# Patient Record
Sex: Female | Born: 1980 | Race: Black or African American | Hispanic: No | Marital: Single | Smoking: Never smoker
Health system: Southern US, Community
[De-identification: ages and names within clinical notes are randomized; demographics above are authoritative.]

## PROBLEM LIST (undated history)

## (undated) DIAGNOSIS — I10 Essential (primary) hypertension: Secondary | ICD-10-CM

## (undated) HISTORY — PX: FOOT SURGERY: SHX648

---

## 2006-06-28 ENCOUNTER — Ambulatory Visit: Payer: Self-pay | Admitting: Podiatry

## 2006-07-20 ENCOUNTER — Ambulatory Visit: Payer: Self-pay | Admitting: Podiatry

## 2006-08-04 ENCOUNTER — Ambulatory Visit: Payer: Self-pay | Admitting: Podiatry

## 2006-11-13 ENCOUNTER — Emergency Department: Payer: Self-pay | Admitting: Emergency Medicine

## 2008-01-25 ENCOUNTER — Ambulatory Visit: Payer: Self-pay | Admitting: Obstetrics and Gynecology

## 2008-03-23 ENCOUNTER — Observation Stay: Payer: Self-pay | Admitting: Obstetrics and Gynecology

## 2008-06-22 ENCOUNTER — Emergency Department: Payer: Self-pay

## 2010-08-14 ENCOUNTER — Ambulatory Visit: Payer: Self-pay | Admitting: Podiatry

## 2011-04-03 ENCOUNTER — Emergency Department: Payer: Self-pay | Admitting: Internal Medicine

## 2012-04-02 ENCOUNTER — Emergency Department: Payer: Self-pay | Admitting: Emergency Medicine

## 2012-10-02 ENCOUNTER — Emergency Department: Payer: Self-pay | Admitting: Emergency Medicine

## 2016-02-17 ENCOUNTER — Emergency Department
Admission: EM | Admit: 2016-02-17 | Discharge: 2016-02-17 | Disposition: A | Discharge status: Home / Self Care | Payer: BLUE CROSS/BLUE SHIELD | Attending: Emergency Medicine | Admitting: Emergency Medicine

## 2016-02-17 ENCOUNTER — Emergency Department: Payer: BLUE CROSS/BLUE SHIELD

## 2016-02-17 ENCOUNTER — Encounter: Payer: Self-pay | Admitting: Emergency Medicine

## 2016-02-17 DIAGNOSIS — I10 Essential (primary) hypertension: Secondary | ICD-10-CM | POA: Diagnosis not present

## 2016-02-17 DIAGNOSIS — R42 Dizziness and giddiness: Secondary | ICD-10-CM | POA: Diagnosis not present

## 2016-02-17 HISTORY — DX: Essential (primary) hypertension: I10

## 2016-02-17 LAB — URINALYSIS COMPLETE WITH MICROSCOPIC (ARMC ONLY)
BILIRUBIN URINE: NEGATIVE
Bacteria, UA: NONE SEEN
GLUCOSE, UA: NEGATIVE mg/dL
HGB URINE DIPSTICK: NEGATIVE
KETONES UR: NEGATIVE mg/dL
LEUKOCYTES UA: NEGATIVE
NITRITE: NEGATIVE
Protein, ur: NEGATIVE mg/dL
RBC / HPF: NONE SEEN RBC/hpf (ref 0–5)
SPECIFIC GRAVITY, URINE: 1.01 (ref 1.005–1.030)
pH: 6 (ref 5.0–8.0)

## 2016-02-17 LAB — BASIC METABOLIC PANEL
ANION GAP: 7 (ref 5–15)
BUN: 10 mg/dL (ref 6–20)
CALCIUM: 9 mg/dL (ref 8.9–10.3)
CHLORIDE: 105 mmol/L (ref 101–111)
CO2: 25 mmol/L (ref 22–32)
Creatinine, Ser: 0.93 mg/dL (ref 0.44–1.00)
GFR calc Af Amer: >60 mL/min (ref >60)
GFR calc non Af Amer: >60 mL/min (ref >60)
GLUCOSE: 100 mg/dL — AB (ref 65–99)
Potassium: 4 mmol/L (ref 3.5–5.1)
Sodium: 137 mmol/L (ref 135–145)

## 2016-02-17 LAB — TROPONIN I: Troponin I: <0.03 ng/mL (ref <0.03)

## 2016-02-17 LAB — CBC
HCT: 36.3 % (ref 35.0–47.0)
HEMOGLOBIN: 12.7 g/dL (ref 12.0–16.0)
MCH: 28.8 pg (ref 26.0–34.0)
MCHC: 34.9 g/dL (ref 32.0–36.0)
MCV: 82.4 fL (ref 80.0–100.0)
PLATELETS: 220 10*3/uL (ref 150–440)
RBC: 4.4 MIL/uL (ref 3.80–5.20)
RDW: 14.4 % (ref 11.5–14.5)
WBC: 10.3 10*3/uL (ref 3.6–11.0)

## 2016-02-17 MED ORDER — MECLIZINE HCL 25 MG PO TABS: 25.0000 mg | ORAL_TABLET | Freq: Three times a day (TID) | ORAL | 0 refills | Status: AC | PRN

## 2016-02-17 MED ORDER — CLONIDINE HCL 0.1 MG PO TABS: 0.1000 mg | ORAL_TABLET | Freq: Two times a day (BID) | ORAL | 0 refills | Status: AC

## 2016-02-17 MED ORDER — MECLIZINE HCL 25 MG PO TABS
25.0000 mg | ORAL_TABLET | Freq: Once | ORAL | Status: AC
  Administered 2016-02-17: 25 mg via ORAL
  Filled 2016-02-17: qty 1

## 2016-02-17 NOTE — Discharge Instructions (Signed)
1. Keep a record of your blood pressures morning, noon and evening. 2. You may take clonidine 0.1 mg twice daily as needed for elevated blood pressure as follows: If the top number is > 180 and/or The bottom number is > 100 3. You may take meclizine as needed for dizziness. 4. Return to the ER for worsening symptoms, persistent vomiting, difficulty breathing or other concerns.

## 2016-02-17 NOTE — ED Triage Notes (Signed)
Patient states that she started feeling dizzy last night and she checked her blood pressure and it was 150/102. Patient states that she does have a history of htn but is normally well controlled with medications.

## 2016-02-17 NOTE — ED Provider Notes (Signed)
[facility] Emergency Department Provider Note   ____________________________________________   First MD Initiated Contact with Patient 02/17/16 0402     (approximate)  I have reviewed the triage vital signs and the nursing notes.   HISTORY  Chief Complaint Dizziness and Hypertension    HPI Melanie Anthony is a 35 y.o. female who presents to the ED from home with a chief complaint of elevated blood pressure and dizziness. Reports she was feeling dizzy last evening so she checked her blood pressure which was 150/102. Baseline BP 140s/80s. Patient does have a history of hypertension on Bystolic taken daily every morning. Denies recent fever, chills, chest pain, shortness of breath, abdominal pain, nausea, vomiting, diarrhea. Denies recent travel or trauma. Nothing makes her symptoms better or worse.   Past Medical History:  Diagnosis Date  . Hypertension     There are no active problems to display for this patient.   Past Surgical History:  Procedure Laterality Date  . FOOT SURGERY      Prior to Admission medications   Medication Sig Start Date End Date Taking? Authorizing Provider  cloNIDine (CATAPRES) 0.1 MG tablet Take 1 tablet (0.1 mg total) by mouth 2 (two) times daily. 02/17/16   Irean HongJade J Zikeria Keough, MD  meclizine (ANTIVERT) 25 MG tablet Take 1 tablet (25 mg total) by mouth 3 (three) times daily as needed for dizziness. 02/17/16   Irean HongJade J Nassim Cosma, MD    Allergies Review of patient's allergies indicates no known allergies.  No family history on file.  Social History Social History  Substance Use Topics  . Smoking status: Never Smoker  . Smokeless tobacco: Never Used  . Alcohol use Not on file     Comment: occ    Review of Systems  Constitutional: No fever/chills. Eyes: No visual changes. ENT: No sore throat. Cardiovascular: Denies chest pain. Respiratory: Denies shortness of breath. Gastrointestinal: No abdominal pain.  No nausea, no  vomiting.  No diarrhea.  No constipation. Genitourinary: Negative for dysuria. Musculoskeletal: Negative for back pain. Skin: Negative for rash. Neurological: Positive for dizziness. Negative for headaches, focal weakness or numbness.  10-point ROS otherwise negative.  ____________________________________________   PHYSICAL EXAM:  VITAL SIGNS: ED Triage Vitals [02/17/16 0105]  Enc Vitals Group     BP (!) 160/94     Pulse Rate 68     Resp 18     Temp 97.7 F (36.5 C)     Temp Source Oral     SpO2 100 %     Weight 170 lb (77.1 kg)     Height 5\' 2"  (1.575 m)     Head Circumference      Peak Flow      Pain Score      Pain Loc      Pain Edu?      Excl. in GC?     Constitutional: Alert and oriented. Well appearing and in no acute distress. Eyes: Conjunctivae are normal. PERRL. EOMI. Head: Atraumatic. Nose: No congestion/rhinnorhea. Mouth/Throat: Mucous membranes are moist.  Oropharynx non-erythematous. Neck: No stridor.  No carotid bruits. Cardiovascular: Normal rate, regular rhythm. Grossly normal heart sounds.  Good peripheral circulation. Respiratory: Normal respiratory effort.  No retractions. Lungs CTAB. Gastrointestinal: Soft and nontender. No distention. No abdominal bruits. No CVA tenderness. Musculoskeletal: No lower extremity tenderness nor edema.  No joint effusions. Neurologic:  Alert and oriented 4. CN II-XII grossly intact. Normal speech and language. No gross focal neurologic deficits are appreciated. No  gait instability. Skin:  Skin is warm, dry and intact. No rash noted. Psychiatric: Mood and affect are normal. Speech and behavior are normal.  ____________________________________________   LABS (all labs ordered are listed, but only abnormal results are displayed)  Labs Reviewed  BASIC METABOLIC PANEL - Abnormal; Notable for the following:       Result Value   Glucose, Bld 100 (*)    All other components within normal limits  URINALYSIS  COMPLETEWITH MICROSCOPIC ([facility] ONLY) - Abnormal; Notable for the following:    Color, Urine STRAW (*)    APPearance CLEAR (*)    Squamous Epithelial / LPF 0-5 (*)    All other components within normal limits  CBC  TROPONIN I  CBG MONITORING, ED   ____________________________________________  EKG  ED ECG REPORT I, Benicio Manna J, the attending physician, personally viewed and interpreted this ECG.   Date: 02/17/2016  EKG Time: 0112  Rate: 61  Rhythm: normal EKG, normal sinus rhythm  Axis: Normal  Intervals:none  ST&T Change: Nonspecific  ____________________________________________  RADIOLOGY  CT head interpreted per Dr. Gwenyth Bender: No acute intracranial pathology. ____________________________________________   PROCEDURES  Procedure(s) performed: None  Procedures  Critical Care performed: No  ____________________________________________   INITIAL IMPRESSION / ASSESSMENT AND PLAN / ED COURSE  Pertinent labs & imaging results that were available during my care of the patient were reviewed by me and considered in my medical decision making (see chart for details).  35 year old female presenting with elevated blood pressure with associated dizziness. Initial laboratory urinalysis results are unremarkable. Will add troponin, head CT, check orthostatics and administer meclizine for dizziness.  Clinical Course  Comment By Time  Dizziness improved. Patient usually takes her blood pressure medicine Bystolic at approximately 9 AM. I have asked her to take it now for elevated blood pressure. Will prescribe clonidine to use as needed for elevated blood pressure as well as meclizine as needed for dizziness. She will follow-up with her PCP closely this week. Strict return precautions given. Patient and spouse verbalize understanding and agree with plan of care. Irean Hong, MD 10/16 0622     ____________________________________________   FINAL CLINICAL IMPRESSION(S) / ED  DIAGNOSES  Final diagnoses:  Essential hypertension  Dizziness      NEW MEDICATIONS STARTED DURING THIS VISIT:  New Prescriptions   CLONIDINE (CATAPRES) 0.1 MG TABLET    Take 1 tablet (0.1 mg total) by mouth 2 (two) times daily.   MECLIZINE (ANTIVERT) 25 MG TABLET    Take 1 tablet (25 mg total) by mouth 3 (three) times daily as needed for dizziness.     Note:  This document was prepared using Dragon voice recognition software and may include unintentional dictation errors.    Irean Hong, MD 02/17/16 [phone]

## 2016-02-17 NOTE — ED Notes (Signed)
Patient transported to CT 

## 2018-12-23 ENCOUNTER — Emergency Department
Admission: EM | Admit: 2018-12-23 | Discharge: 2018-12-23 | Disposition: A | Discharge status: Home / Self Care | Payer: BC Managed Care – PPO | Attending: Emergency Medicine | Admitting: Emergency Medicine

## 2018-12-23 ENCOUNTER — Other Ambulatory Visit: Payer: Self-pay

## 2018-12-23 ENCOUNTER — Emergency Department: Payer: BC Managed Care – PPO

## 2018-12-23 DIAGNOSIS — R51 Headache: Secondary | ICD-10-CM | POA: Insufficient documentation

## 2018-12-23 DIAGNOSIS — R519 Headache, unspecified: Secondary | ICD-10-CM

## 2018-12-23 DIAGNOSIS — Z79899 Other long term (current) drug therapy: Secondary | ICD-10-CM | POA: Diagnosis not present

## 2018-12-23 DIAGNOSIS — I10 Essential (primary) hypertension: Secondary | ICD-10-CM | POA: Insufficient documentation

## 2018-12-23 DIAGNOSIS — R001 Bradycardia, unspecified: Secondary | ICD-10-CM | POA: Diagnosis not present

## 2018-12-23 DIAGNOSIS — R42 Dizziness and giddiness: Secondary | ICD-10-CM | POA: Insufficient documentation

## 2018-12-23 LAB — COMPREHENSIVE METABOLIC PANEL
ALT: 19 U/L (ref 0–44)
AST: 31 U/L (ref 15–41)
Albumin: 4 g/dL (ref 3.5–5.0)
Alkaline Phosphatase: 38 U/L (ref 38–126)
Anion gap: 10 (ref 5–15)
BUN: 10 mg/dL (ref 6–20)
CO2: 22 mmol/L (ref 22–32)
Calcium: 9.4 mg/dL (ref 8.9–10.3)
Chloride: 105 mmol/L (ref 98–111)
Creatinine, Ser: 0.79 mg/dL (ref 0.44–1.00)
GFR calc Af Amer: >60 mL/min (ref >60)
GFR calc non Af Amer: >60 mL/min (ref >60)
Glucose, Bld: 82 mg/dL (ref 70–99)
Potassium: 4.6 mmol/L (ref 3.5–5.1)
Sodium: 137 mmol/L (ref 135–145)
Total Bilirubin: 0.8 mg/dL (ref 0.3–1.2)
Total Protein: 7.8 g/dL (ref 6.5–8.1)

## 2018-12-23 LAB — CBC WITH DIFFERENTIAL/PLATELET
Abs Immature Granulocytes: 0.02 10*3/uL (ref 0.00–0.07)
Basophils Absolute: 0 10*3/uL (ref 0.0–0.1)
Basophils Relative: 0 %
Eosinophils Absolute: 0.1 10*3/uL (ref 0.0–0.5)
Eosinophils Relative: 1 %
HCT: 38.4 % (ref 36.0–46.0)
Hemoglobin: 12.4 g/dL (ref 12.0–15.0)
Immature Granulocytes: 0 %
Lymphocytes Relative: 26 %
Lymphs Abs: 2.4 10*3/uL (ref 0.7–4.0)
MCH: 27.6 pg (ref 26.0–34.0)
MCHC: 32.3 g/dL (ref 30.0–36.0)
MCV: 85.5 fL (ref 80.0–100.0)
Monocytes Absolute: 0.6 10*3/uL (ref 0.1–1.0)
Monocytes Relative: 7 %
Neutro Abs: 6 10*3/uL (ref 1.7–7.7)
Neutrophils Relative %: 66 %
Platelets: 265 10*3/uL (ref 150–400)
RBC: 4.49 MIL/uL (ref 3.87–5.11)
RDW: 13.3 % (ref 11.5–15.5)
WBC: 9.2 10*3/uL (ref 4.0–10.5)
nRBC: 0 % (ref 0.0–0.2)

## 2018-12-23 LAB — POCT PREGNANCY, URINE: Preg Test, Ur: NEGATIVE

## 2018-12-23 MED ORDER — TRAMADOL HCL 50 MG PO TABS
50.0000 mg | ORAL_TABLET | Freq: Once | ORAL | Status: AC
  Administered 2018-12-23: 19:00:00 50 mg via ORAL
  Filled 2018-12-23: qty 1

## 2018-12-23 NOTE — Discharge Instructions (Signed)
Please follow-up with primary care regarding hypertension.

## 2018-12-23 NOTE — ED Provider Notes (Signed)
[facility] Emergency Department Provider Note  ____________________________________________  Time seen: Approximately 6:42 PM  I have reviewed the triage vital signs and the nursing notes.   HISTORY  Chief Complaint Hypertension and Headache    HPI Melanie Anthony is a 38 y.o. female presents to the emergency department with 10 out of 10 headache and hypertension noted at home.  Patient states that she took her blood pressure this morning and it was 161/116.  Patient states that she has also had dizziness.  She denies chest pain, chest tightness or shortness of breath.  Patient states that she takes amlodipine daily for hypertension.  Patient states that she has been compliant with her medications and not has not had any recent changes in dosing.  No other alleviating measures have been attempted.        Past Medical History:  Diagnosis Date  . Hypertension     There are no active problems to display for this patient.   Past Surgical History:  Procedure Laterality Date  . FOOT SURGERY      Prior to Admission medications   Medication Sig Start Date End Date Taking? Authorizing Provider  cloNIDine (CATAPRES) 0.1 MG tablet Take 1 tablet (0.1 mg total) by mouth 2 (two) times daily. 02/17/16   Irean HongSung, Jade J, MD  meclizine (ANTIVERT) 25 MG tablet Take 1 tablet (25 mg total) by mouth 3 (three) times daily as needed for dizziness. 02/17/16   Irean HongSung, Jade J, MD    Allergies Patient has no known allergies.  History reviewed. No pertinent family history.  Social History Social History   Tobacco Use  . Smoking status: Never Smoker  . Smokeless tobacco: Never Used  Substance Use Topics  . Alcohol use: Not on file    Comment: occ  . Drug use: No     Review of Systems  Constitutional: No fever/chills Eyes: No visual changes. No discharge ENT: No upper respiratory complaints. Cardiovascular: no chest pain. Respiratory: no cough. No  SOB. Gastrointestinal: No abdominal pain.  No nausea, no vomiting.  No diarrhea.  No constipation. Genitourinary: Negative for dysuria. No hematuria Musculoskeletal: Negative for musculoskeletal pain. Skin: Negative for rash, abrasions, lacerations, ecchymosis. Neurological: Patient has headache, no focal weakness or numbness.   ____________________________________________   PHYSICAL EXAM:  VITAL SIGNS: ED Triage Vitals  Enc Vitals Group     BP 12/23/18 1740 140/83     Pulse Rate 12/23/18 1740 (!) 58     Resp 12/23/18 1740 18     Temp 12/23/18 1740 99.6 F (37.6 C)     Temp Source 12/23/18 1740 Oral     SpO2 12/23/18 1740 100 %     Weight 12/23/18 1745 170 lb (77.1 kg)     Height 12/23/18 1745 5\' 3"  (1.6 m)     Head Circumference --      Peak Flow --      Pain Score 12/23/18 1745 10     Pain Loc --      Pain Edu? --      Excl. in GC? --      Constitutional: Alert and oriented. Well appearing and in no acute distress. Eyes: Conjunctivae are normal. PERRL. EOMI. Head: Atraumatic. ENT:      Nose: No congestion/rhinnorhea.      Mouth/Throat: Mucous membranes are moist.  Neck: No stridor.  No cervical spine tenderness to palpation. Hematological/Lymphatic/Immunilogical: No cervical lymphadenopathy. Cardiovascular: Normal rate, regular rhythm. Normal S1 and S2.  Good  peripheral circulation. Respiratory: Normal respiratory effort without tachypnea or retractions. Lungs CTAB. Good air entry to the bases with no decreased or absent breath sounds. Musculoskeletal: Full range of motion to all extremities. No gross deformities appreciated. Neurologic:  Normal speech and language. No gross focal neurologic deficits are appreciated.  Skin:  Skin is warm, dry and intact. No rash noted. Psychiatric: Mood and affect are normal. Speech and behavior are normal. Patient exhibits appropriate insight and judgement.   ____________________________________________   LABS (all labs ordered  are listed, but only abnormal results are displayed)  Labs Reviewed  CBC WITH DIFFERENTIAL/PLATELET  COMPREHENSIVE METABOLIC PANEL  POC URINE PREG, ED  POCT PREGNANCY, URINE   ____________________________________________  EKG   ____________________________________________  RADIOLOGY I personally viewed and evaluated these images as part of my medical decision making, as well as reviewing the written report by the radiologist.    Ct Head Wo Contrast  Result Date: 12/23/2018 CLINICAL DATA:  Cerebral hemorrhage suspected.  Hypertension EXAM: CT HEAD WITHOUT CONTRAST TECHNIQUE: Contiguous axial images were obtained from the base of the skull through the vertex without intravenous contrast. COMPARISON:  03/19/2016 FINDINGS: Brain: No evidence of acute infarction, hemorrhage, hydrocephalus, or mass lesion. 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 Vascular: No hyperdense vessel or unexpected calcification. Skull: Normal. Negative for fracture or focal lesion. Sinuses/Orbits: No acute finding. IMPRESSION: 1. No acute finding. 2. Small remote right cerebellar infarct. Electronically Signed   By: Marnee SpringJonathon  Watts M.D.   On: 12/23/2018 19:40    ____________________________________________    PROCEDURES  Procedure(s) performed:    Procedures    Medications  traMADol (ULTRAM) tablet 50 mg (50 mg Oral Given 12/23/18 1842)     ____________________________________________   INITIAL IMPRESSION / ASSESSMENT AND PLAN / ED COURSE  Pertinent labs & imaging results that were available during my care of the patient were reviewed by me and considered in my medical decision making (see chart for details).  Review of the [place] CSRS was performed in accordance of the [facility] prior to dispensing any controlled drugs.         Assessment and plan Hypertension Headache 38 year old female presents to the emergency  department with headache and hypertension for the past 1 to 2 days.  Patient reports that headache is 10 out of 10 in intensity  Patient was mildly bradycardic at triage but vital signs were otherwise stable.  Neuro exam was reassuring and appropriate for age  Differential diagnosis includes subarachnoid hemorrhage, headache, hypertension.  CT head revealed no evidence of intracranial bleed.  Patient was given tramadol in the emergency department and she reported that her headache improved significantly.  Patient's blood pressure was within the parameters of normal while in the emergency department and I do not feel comfortable adjusting her blood pressure medications at this time.  She was advised to follow-up with primary care if hypertension if hypertension persists.  All patient questions were answered.  ____________________________________________  FINAL CLINICAL IMPRESSION(S) / ED DIAGNOSES  Final diagnoses:  Hypertension, unspecified type  Acute nonintractable headache, unspecified headache type      NEW MEDICATIONS STARTED DURING THIS VISIT:  ED Discharge Orders    None          This chart was dictated using voice recognition software/Dragon. Despite best efforts to proofread, errors can occur which can change the meaning. Any change was purely unintentional.    Orvil FeilWoods, Glori Machnik M, PA-C 12/23/18 2054  Nance Pear, MD 12/23/18 2124

## 2018-12-23 NOTE — ED Triage Notes (Signed)
Pt arrives via ems from home Pt  reports taking BP at home last night with reading of 161/116, this morning was 160/119. Pt report hx of htn and takes medication for it. Pt reports taking medication after BP reading this AM. Initial BP reading in ED is 140/83. NAD noted at this time.

## 2019-05-17 ENCOUNTER — Other Ambulatory Visit: Payer: Self-pay | Admitting: Neurology

## 2019-05-17 DIAGNOSIS — I6381 Other cerebral infarction due to occlusion or stenosis of small artery: Secondary | ICD-10-CM

## 2019-05-29 ENCOUNTER — Ambulatory Visit
Admission: RE | Admit: 2019-05-29 | Discharge: 2019-05-29 | Disposition: A | Discharge status: Home / Self Care | Payer: BC Managed Care – PPO | Source: Ambulatory Visit | Attending: Neurology | Admitting: Neurology

## 2019-05-29 ENCOUNTER — Other Ambulatory Visit: Payer: Self-pay

## 2019-05-29 DIAGNOSIS — I6381 Other cerebral infarction due to occlusion or stenosis of small artery: Secondary | ICD-10-CM | POA: Diagnosis present

## 2020-09-09 ENCOUNTER — Other Ambulatory Visit: Payer: Self-pay | Admitting: Nurse Practitioner

## 2020-09-09 DIAGNOSIS — Z1231 Encounter for screening mammogram for malignant neoplasm of breast: Secondary | ICD-10-CM

## 2020-10-14 ENCOUNTER — Ambulatory Visit
Admission: RE | Admit: 2020-10-14 | Discharge: 2020-10-14 | Disposition: A | Discharge status: Home / Self Care | Payer: BC Managed Care – PPO | Source: Ambulatory Visit | Attending: Nurse Practitioner | Admitting: Nurse Practitioner

## 2020-10-14 ENCOUNTER — Other Ambulatory Visit: Payer: Self-pay

## 2020-10-14 DIAGNOSIS — Z1231 Encounter for screening mammogram for malignant neoplasm of breast: Secondary | ICD-10-CM | POA: Diagnosis present

## 2020-12-26 ENCOUNTER — Other Ambulatory Visit: Payer: Self-pay | Admitting: Neurology

## 2020-12-26 DIAGNOSIS — R4189 Other symptoms and signs involving cognitive functions and awareness: Secondary | ICD-10-CM

## 2020-12-26 DIAGNOSIS — G44099 Other trigeminal autonomic cephalgias (TAC), not intractable: Secondary | ICD-10-CM

## 2021-01-07 ENCOUNTER — Ambulatory Visit
Admission: RE | Admit: 2021-01-07 | Discharge: 2021-01-07 | Disposition: A | Discharge status: Home / Self Care | Payer: Medicaid Other | Source: Ambulatory Visit | Attending: Neurology | Admitting: Neurology

## 2021-01-07 ENCOUNTER — Other Ambulatory Visit: Payer: Self-pay

## 2021-01-07 DIAGNOSIS — G44099 Other trigeminal autonomic cephalgias (TAC), not intractable: Secondary | ICD-10-CM | POA: Insufficient documentation

## 2021-01-07 DIAGNOSIS — R4189 Other symptoms and signs involving cognitive functions and awareness: Secondary | ICD-10-CM | POA: Diagnosis present

## 2021-01-07 MED ORDER — GADOBUTROL 1 MMOL/ML IV SOLN
7.5000 mL | Freq: Once | INTRAVENOUS | Status: AC | PRN
  Administered 2021-01-07: 7.5 mL via INTRAVENOUS

## 2021-10-03 ENCOUNTER — Other Ambulatory Visit: Payer: Self-pay | Admitting: Internal Medicine

## 2021-10-03 DIAGNOSIS — M722 Plantar fascial fibromatosis: Secondary | ICD-10-CM

## 2021-10-31 ENCOUNTER — Ambulatory Visit
Admission: RE | Admit: 2021-10-31 | Discharge: 2021-10-31 | Disposition: A | Discharge status: Home / Self Care | Payer: Disability Insurance | Source: Ambulatory Visit | Attending: Internal Medicine | Admitting: Internal Medicine

## 2021-10-31 ENCOUNTER — Ambulatory Visit
Admission: RE | Admit: 2021-10-31 | Discharge: 2021-10-31 | Disposition: A | Discharge status: Home / Self Care | Payer: Disability Insurance | Attending: Internal Medicine | Admitting: Internal Medicine

## 2021-10-31 DIAGNOSIS — M722 Plantar fascial fibromatosis: Secondary | ICD-10-CM

## 2022-10-21 IMAGING — MR MR HEAD WO/W CM
12 series · 48 of 48 positions shown · IV contrast (gadavist)
Comparison: Brain MRI 05/29/2019, MRA head 05/29/2019.

CLINICAL DATA: Trigeminal autonomic cephalgia. Cognitive
impairment. Additional history provided by scanning technologist:
Patient reports left-sided headache beside eye, increasing with
increased blood pressure.

EXAM:
MRI HEAD WITHOUT AND WITH CONTRAST
TECHNIQUE: Multiplanar, multiecho pulse sequences of the brain and surrounding
structures were obtained without and with intravenous contrast.
CONTRAST:  7.5mL GADAVIST GADOBUTROL 1 MMOL/ML IV SOLN

[Series 3: DWI · axial · 3.0mm · 1.20mm/px · z∈[+2,+163]mm · 7 of 109 slices shown (1 of 4)]
[im 1/109]
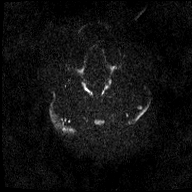
[im 19/109]
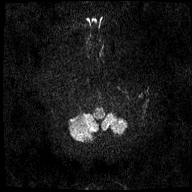
[im 37/109]
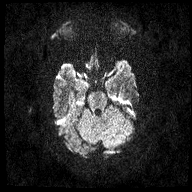
[im 55/109]
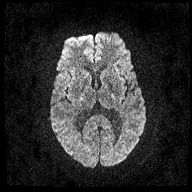
[im 73/109]
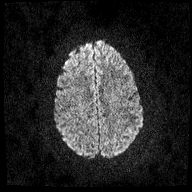
[im 91/109]
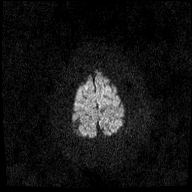
[im 109/109]
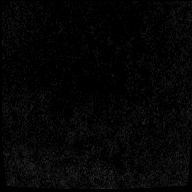

[Series 4: DWI · axial · 3.0mm · 1.20mm/px · z∈[+2,+163]mm · 4 of 55 slices shown (2 of 4)]
[im 1/55]
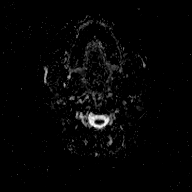
[im 19/55]
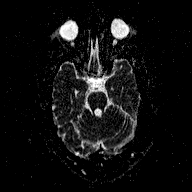
[im 37/55]
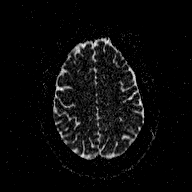
[im 55/55]
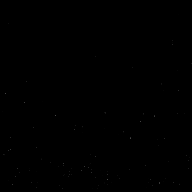

[Series 5: DWI · coronal · 3.0mm · 1.15mm/px · 6 of 102 slices shown (3 of 4)]
[im 1/102]
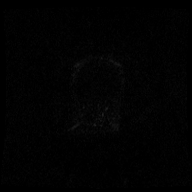
[im 21/102]
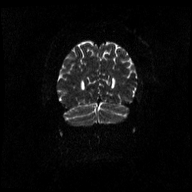
[im 41/102]
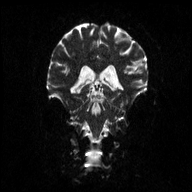
[im 61/102]
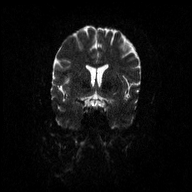
[im 81/102]
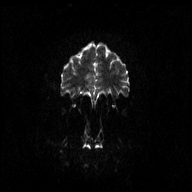
[im 102/102]
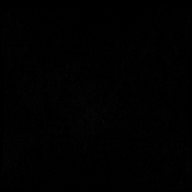

[Series 6: DWI · coronal · 3.0mm · 1.15mm/px · 3 of 51 slices shown (4 of 4)]
[im 1/51]
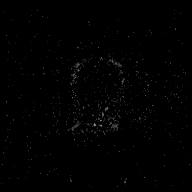
[im 26/51]
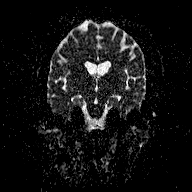
[im 51/51]
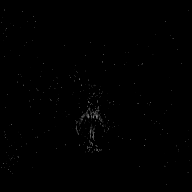

[Series 7: T1 · sagittal · 5.0mm · 0.45mm/px · 1 of 23 slices shown (1 of 2)]
[im 1/23]
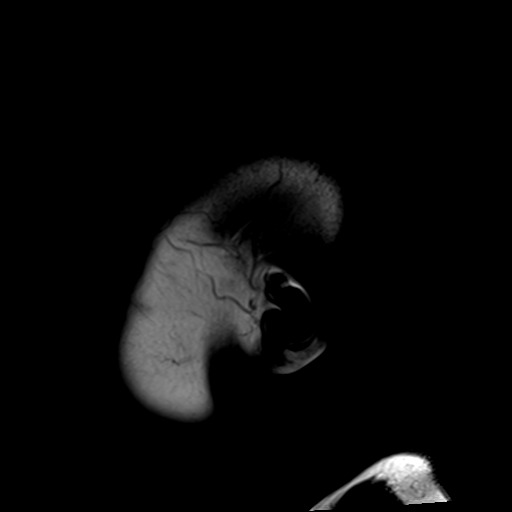

[Series 8: T2 · axial · 5.0mm · 0.72mm/px · 1 of 23 slices shown (1 of 2)]
[im 1/23]
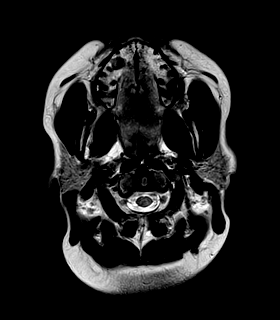

[Series 9: FLAIR · axial · 3.0mm · 0.45mm/px · z∈[+1,+162]mm · 3 of 55 slices shown]
[im 1/55]
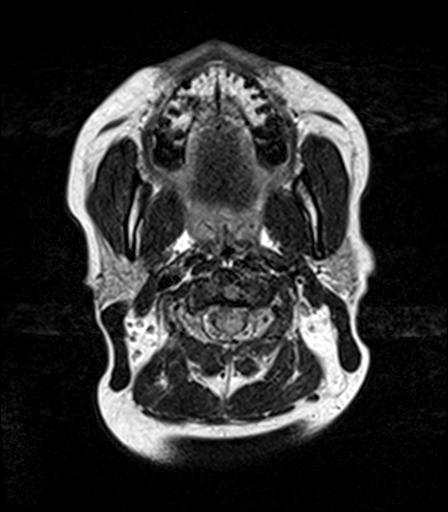
[im 28/55]
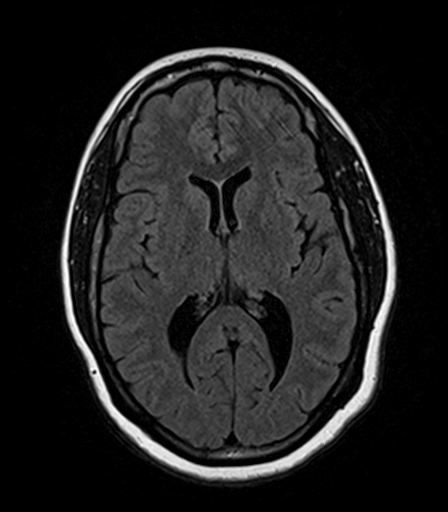
[im 55/55]
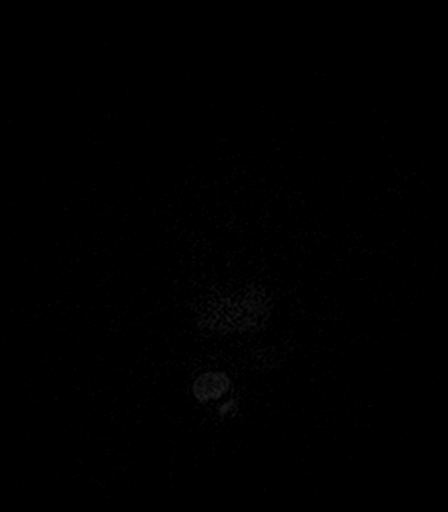

[Series 10: T2 · axial · 5.0mm · 0.72mm/px · 1 of 23 slices shown (2 of 2)]
[im 1/23]
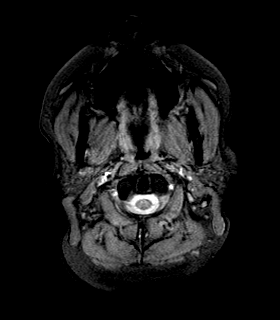

[Series 11: T1 · axial · 1.0mm · 0.94mm/px · z∈[+3,+161]mm · 9 of 160 slices shown (2 of 2)]
[im 1/160]
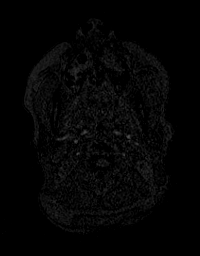
[im 20/160]
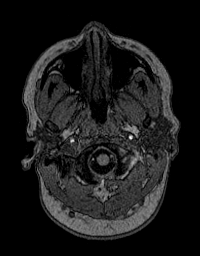
[im 40/160]
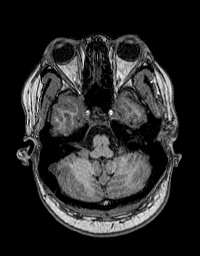
[im 60/160]
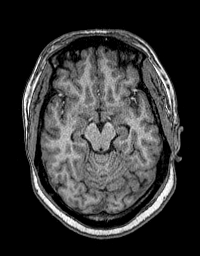
[im 80/160]
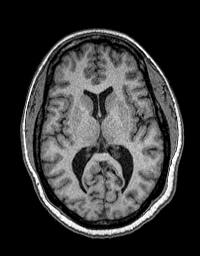
[im 100/160]
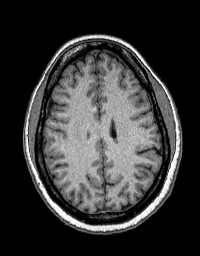
[im 120/160]
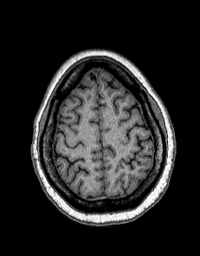
[im 140/160]
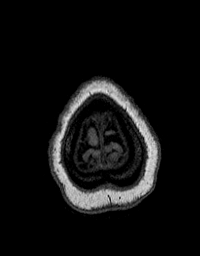
[im 160/160]
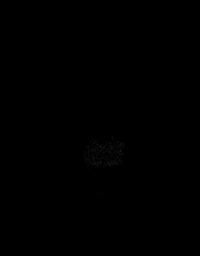

[Series 12: T2 post-contrast · coronal · 5.0mm · 0.43mm/px · 2 of 31 slices shown]
[im 1/31]
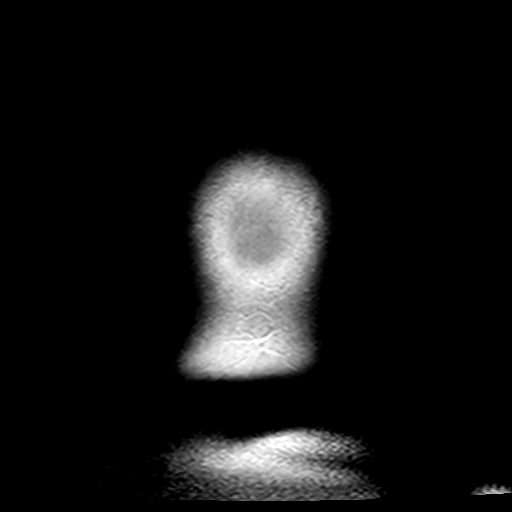
[im 31/31]
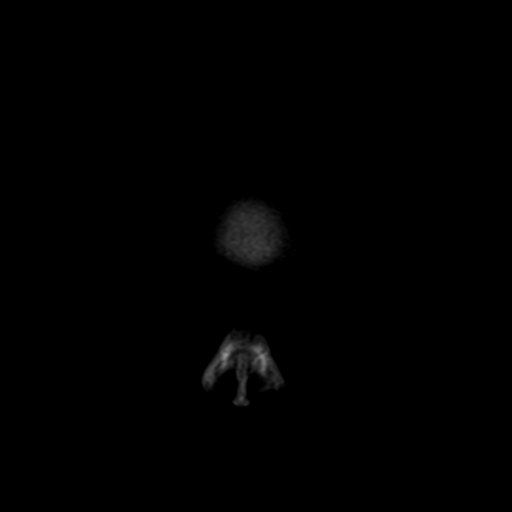

[Series 13: T1 post-contrast · axial · 1.0mm · 0.94mm/px · z∈[+3,+161]mm · 9 of 160 slices shown (1 of 2)]
[im 1/160]
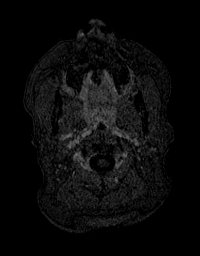
[im 20/160]
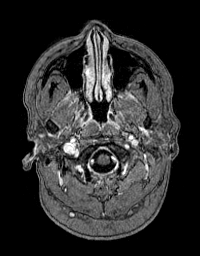
[im 40/160]
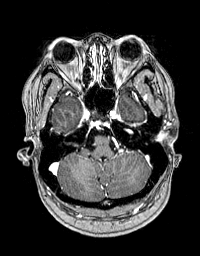
[im 60/160]
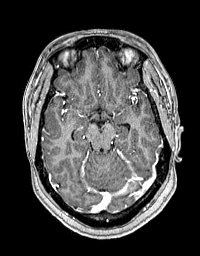
[im 80/160]
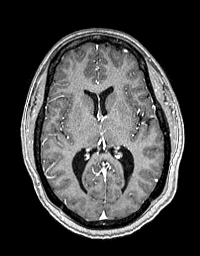
[im 100/160]
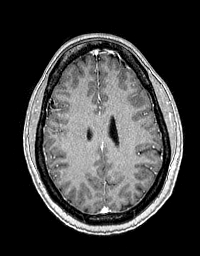
[im 120/160]
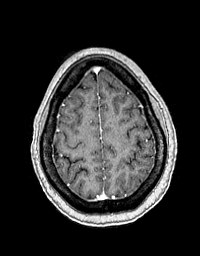
[im 140/160]
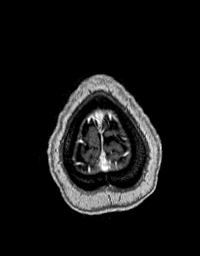
[im 160/160]
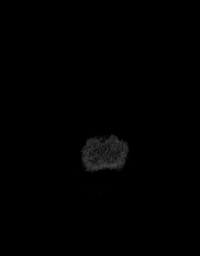

[Series 14: T1 post-contrast · coronal · 5.0mm · 0.43mm/px · 2 of 31 slices shown (2 of 2)]
[im 1/31]
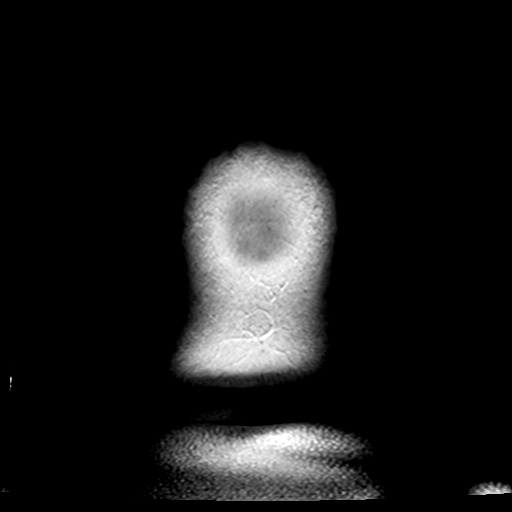
[im 31/31]
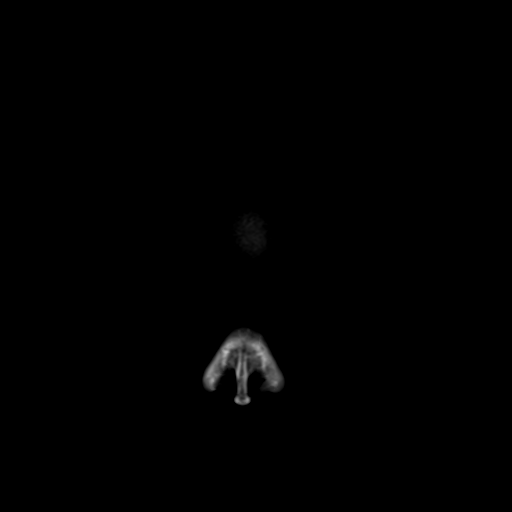

[48 of 48 positions shown; findings below may reference images not displayed]

FINDINGS: Brain:

Cerebral volume is normal.

There are a few small scattered foci of T2/FLAIR hyperintense signal
abnormality within the bilateral cerebral white matter, nonspecific
but most often secondary to chronic small vessel ischemia.

Redemonstrated small chronic infarct within the right cerebellar
hemisphere.

There is no acute infarct.

No evidence of an intracranial mass.

No chronic intracranial blood products.

No extra-axial fluid collection.

No midline shift.

No pathologic intracranial enhancement.

Vascular: Maintained flow voids within the proximal large arterial
vessels.

Skull and upper cervical spine: No focal suspicious marrow lesion.

Sinuses/Orbits: Visualized orbits show no acute finding. No
significant paranasal sinus disease.
IMPRESSION: No evidence of acute intracranial abnormality.

A few small scattered foci of T2/FLAIR hyperintense signal
abnormality within the cerebral white matter are nonspecific, but
most often secondary to chronic small vessel ischemia. These signal
changes are stable as compared to the brain MRI of 05/29/2019.

Redemonstrated small chronic infarct within the right cerebellar
hemisphere.

## 2024-04-17 ENCOUNTER — Ambulatory Visit: Admitting: Speech Pathology

## 2024-04-17 DIAGNOSIS — R41841 Cognitive communication deficit: Secondary | ICD-10-CM

## 2024-04-17 NOTE — Therapy (Signed)
 OUTPATIENT SPEECH LANGUAGE PATHOLOGY  COGNITION EVALUATION   Patient Name: Melanie Anthony MRN: [id number] DOB:12/19/1980, 43 y.o., female Today's Date: 04/17/2024  PCP: Glendia Metro, PA REFERRING PROVIDER: Jannett Fairly, MD   End of Session - 04/17/24 1118     Visit Number 1    Number of Visits 25    Date for Recertification  07/10/24    Authorization Type [place] Medicaid    Progress Note Due on Visit 10    SLP Start Time 1030   Pt arrived late because she got lost in the parking lot   SLP Stop Time  1100    SLP Time Calculation (min) 30 min    Activity Tolerance Patient tolerated treatment well          Past Medical History:  Diagnosis Date   Hypertension    Past Surgical History:  Procedure Laterality Date   FOOT SURGERY     There are no active problems to display for this patient.   ONSET DATE: chronic issue since childhood, documented in chart > 2021; date of referral  04/07/2024  REFERRING DIAG: G31.84 (ICD-10-CM) - Mild cognitive impairment of uncertain or unknown etiology  THERAPY DIAG:  Cognitive communication deficit  Rationale for Evaluation and Treatment Rehabilitation  SUBJECTIVE:   SUBJECTIVE STATEMENT: Pt pleasant Pt accompanied by: self  PERTINENT HISTORY and DIAGNOSTIC FINDINGS:   Pt is a 43 year old female with the following medical history: (12/23/2018) -the patient was seen at [facility] ED for a headache and the CT Head revealed a small chronic right cerebellar infarct and the patient was discharged and scheduled to follow up with Neurology. Upon follow up, the patient underwent a MR angio of the head and MR brain w/o contrast which also confirmed the presence of a small chronic right cerebellar infarct, she was placed on Aspirin 81mg  and the patient was referred to our office to rule of possible cardiac etiology.  12/19/2020 SLUMS 12/30  01/07/2021 - MRI There are a few small scattered foci of T2/FLAIR hyperintense signal abnormality within  the bilateral cerebral white matter, nonspecific but most often secondary to chronic small vessel ischemia.   Redemonstrated small chronic infarct within the right cerebellar hemisphere.   10/07/2021 [facility] Previous neuological assessment felt pt's memory deficts were related to vitamin B deficieny, poor sleep and anxiety   PAIN:  Are you having pain? No   FALLS: Has patient fallen in last 6 months?  No  LIVING ENVIRONMENT: Lives with: lives with their family, lives with their daughter, and and pt's mother Lives in: House/apartment  PLOF:  Level of assistance: Needed assistance with ADLs, Needed assistance with IADLS Employment: On disability d/t memory issues   PATIENT GOALS   to improve memory  OBJECTIVE:   COGNITIVE COMMUNICATION Overall cognitive status: Impaired Areas of impairment:  Memory: Impaired: Immediate Working Short term Nurse, Mental Health Impairments: Pt reports inability to work d/t memory deficits. She reports that at her last job while working at [employer] (2022) she was not able to find her work station after being in the breakroom so she stoped working there after 1 week. She reports having no sense of direction, need to use GPS (even for familiar locations), inability to sing familiar songs along with the music d/t decresed recall of lyrics. she reports attempts at using external memory aids such as calendar app and timers.  She reports chronic struggle since childhood, further reporting that she  graduated from higschool but that it was a struggle because I have had always had trouble with my memory but it is getting worse.  AUDITORY COMPREHENSION  Overall auditory comprehension: Appears intact YES/NO questions: Appears intact Following directions: Appears intact Conversation: Simple Interfering components: working Research Scientist (life Sciences):  repetition/stressing words and written cues   EXPRESSION: verbal  VERBAL EXPRESSION:   Overall verbal expression: Appears intact Level of generative/spontaneous verbalization: conversation Automatic speech: name: intact and social response: intact  Repetition: Appears intact Naming: Responsive: 76-100%, Confrontation: 76-100%, Convergent: 76-100%, and Divergent: 76-100% Pragmatics: Appears intact Interfering components: Non-verbal means of communication: N/A  WRITTEN EXPRESSION: Dominant hand: right Written expression: Not tested  ORAL MOTOR EXAMINATION Facial : WFL Lingual: WFL Velum: WFL Mandible: WFL Cough: WFL Voice: WFL  MOTOR SPEECH: Overall motor speech: Appears intact Phonation: normal Resonance: WFL Articulation: Appears intact Intelligibility: Intelligible Motor planning: Appears intact   STANDARDIZED ASSESSMENTS: Unable to complete as pt arrived 15 minutes late to session  PATIENT REPORTED OUTCOME MEASURES (PROM): Will complete over the next 3 sessions   TODAY'S TREATMENT:  N/A   PATIENT EDUCATION: Education details: ST POC Person educated: Patient Education method: Explanation Education comprehension: needs further education   HOME EXERCISE PROGRAM:   N/A   GOALS:  Goals reviewed with patient? Yes  SHORT TERM GOALS: Target date: 10 sessions  To identify emerging areas of strength/need, patient will participate in ongoing diagnostic treatment (Mini Addenbrooke's Cognitive Examination, paragraph recall, functional reading/writing)  Baseline: not able to administer d/t pt arriving late to session Goal status: INITIAL  2.  Pt. will recall 2/4 memory strategies (w-write it, r-repeat, a-associate it, p-picture) and utilize strategies in structured and functional memory tasks to recall 80% of presented information given moderate cues.   Baseline: Maximal use of external memory strategies Goal status: INITIAL  3.   With Min A, patient will  use external aid for functional recall of completed/upcoming activities 75% accuracy.    Baseline: Moderate A Goal status: INITIAL   LONG TERM GOALS: Target date: 07/11/2023   With Supervision A, patient will use external aids to manage appointments, errands, and household chores consistently between 4 therapy sessions.  Baseline: Moderate A Goal status: INITIAL  2.  With Supervision A, pt will demo knowledge that he needs to use practical compensation in a situation which has been difficult in the past, x2 sessions Baseline: Moderate A Goal status: INITIAL   ASSESSMENT:  CLINICAL IMPRESSION: Patient is a 43 y.o. female who was seen today for a cognitive communication evaluation d/t report of memory loss.  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   OBJECTIVE IMPAIRMENTS include memory. These impairments are limiting patient from return to work, managing medications, managing appointments, managing finances, household responsibilities, and ADLs/IADLs. Factors affecting potential to achieve goals and functional outcome are ability to learn/carryover information, previous level of function, and severity of impairments. Patient will benefit from skilled SLP services to address above impairments and improve overall function.  REHAB POTENTIAL: Good  PLAN: SLP FREQUENCY: 1-2x/week  SLP DURATION: 12 weeks  PLANNED INTERVENTIONS: Cognitive reorganization, Internal/external aids, Functional tasks, SLP instruction and feedback, Compensatory strategies, and Patient/family education   Tehillah Cipriani B. Rubbie, M.S., CCC-SLP, Tree Surgeon Certified Brain Injury Specialist [facility]  [facility] Rehabilitation Services Office [phone] Ascom [phone] Fax [phone]

## 2024-04-24 ENCOUNTER — Ambulatory Visit: Admitting: Speech Pathology

## 2024-04-24 DIAGNOSIS — R41841 Cognitive communication deficit: Secondary | ICD-10-CM

## 2024-04-24 NOTE — Therapy (Signed)
 " OUTPATIENT SPEECH LANGUAGE PATHOLOGY  TREATMENT NOTE   Patient Name: Melanie Anthony MRN: [id number] DOB:05-28-1980, 43 y.o., female Today's Date: 04/24/2024  PCP: Glendia Metro, PA REFERRING PROVIDER: Jannett Fairly, MD   End of Session - 04/24/24 1430     Visit Number 2    Number of Visits 25    Date for Recertification  07/10/24    Authorization Type  Medicaid    Authorization Time Period 04/24/2024 thru 07/23/2024    Authorization - Visit Number 1    Authorization - Number of Visits 18    Progress Note Due on Visit 10    SLP Start Time 1435    SLP Stop Time  1520    SLP Time Calculation (min) 45 min    Activity Tolerance Patient tolerated treatment well          Past Medical History:  Diagnosis Date   Hypertension    Past Surgical History:  Procedure Laterality Date   FOOT SURGERY     There are no active problems to display for this patient.   ONSET DATE: chronic issue since childhood, documented in chart > 2021; date of referral  04/07/2024  REFERRING DIAG: G31.84 (ICD-10-CM) - Mild cognitive impairment of uncertain or unknown etiology  THERAPY DIAG:  Cognitive communication deficit  Rationale for Evaluation and Treatment Rehabilitation  SUBJECTIVE:   PERTINENT HISTORY and DIAGNOSTIC FINDINGS:   Pt is a 43 year old female with the following medical history: (12/23/2018) -the patient was seen at [facility] ED for a headache and the CT Head revealed a small chronic right cerebellar infarct and the patient was discharged and scheduled to follow up with Neurology. Upon follow up, the patient underwent a MR angio of the head and MR brain w/o contrast which also confirmed the presence of a small chronic right cerebellar infarct, she was placed on Aspirin 81mg  and the patient was referred to our office to rule of possible cardiac etiology.  12/19/2020 SLUMS 12/30  01/07/2021 - MRI There are a few small scattered foci of T2/FLAIR hyperintense signal abnormality  within the bilateral cerebral white matter, nonspecific but most often secondary to chronic small vessel ischemia.   Redemonstrated small chronic infarct within the right cerebellar hemisphere.   10/07/2021 [facility] Previous neuological assessment felt pt's memory deficts were related to vitamin B deficieny, poor sleep and anxiety   PAIN:  Are you having pain? No   FALLS: Has patient fallen in last 6 months?  No  LIVING ENVIRONMENT: Lives with: lives with their family, lives with their daughter, and and pt's mother Lives in: House/apartment  PLOF:  Level of assistance: Needed assistance with ADLs, Needed assistance with IADLS Employment: On disability d/t memory issues   PATIENT GOALS   to improve memory  SUBJECTIVE STATEMENT: Pt pleasant, eager Pt accompanied by: self  OBJECTIVE:   TODAY'S TREATMENT:   Skilled treatment session focused on pt's cognitive communication goals. SLP facilitated session by providing the following interventions:  Pt has questions regarding etiology of memory deficits - this writer unable to answer and defers to neurology - pt is not able to recall details from previous doctor's appts such as recommendation to take Vitamin D as an OTC supplement (pt stated, it wasn't called into the pharmacy so I couldn't start taking it) skilled information written down to send My Chart message to neurologist for instructions. Overall pt seems unaware of recommendations made by neurology and other  providers.   Pt also reports that she was not able to find my office again today. While she reports that this worries me she is more laid back about use of compensatory strategies to aid in recall printed list of patient specific strategies to use to aid in recall of information were created with pt.     PATIENT REPORTED OUTCOME MEASURES (PROM):   MULTIFACTORIAL MEMORY QUESTIONNAIRE  (MMQ)  Administered patient self-reported outcome measure Multifactorial Memory Questionnaire (MMQ). The Multifactorial Memory Questionnaire [facility]) consists of three scales measuring separate aspects of metamemory; Satisfaction, Ability and Strategy.   Pt's responses are converted to T-Scores with severity levels based on pt's T-Score.   Severity Levels (T-score) Very Low - < 20 Low - 20 to 29 Below Average - 30-39 Average - 40 to 60 Above Average - 60 to 70 High - 71 to 80 Very High - > 80  Pt reports:  Low - 20 to 29 Memory Satisfaction (T-score: 23) Below Average - 30-39 Memory Ability (T-score: 33) Average - 40 to 60 use of Memory Strategies (T-score: 42)     PATIENT EDUCATION: Education details: see above Person educated: Patient Education method: Explanation Education comprehension: needs further education   HOME EXERCISE PROGRAM:  Being implementing use of external memory aids    GOALS:  Goals reviewed with patient? Yes  SHORT TERM GOALS: Target date: 10 sessions  To identify emerging areas of strength/need, patient will participate in ongoing diagnostic treatment (Mini Addenbrooke's Cognitive Examination, paragraph recall, functional reading/writing)  Baseline: not able to administer d/t pt arriving late to session Goal status: INITIAL  2.  Pt. will recall 2/4 memory strategies (w-write it, r-repeat, a-associate it, p-picture) and utilize strategies in structured and functional memory tasks to recall 80% of presented information given moderate cues.   Baseline: Maximal use of external memory strategies Goal status: INITIAL  3.   With Min A, patient will use external aid for functional recall of completed/upcoming activities 75% accuracy.    Baseline: Moderate A Goal status: INITIAL   LONG TERM GOALS: Target date: 07/11/2023   With Supervision A, patient will use external aids to manage appointments, errands, and household chores consistently between 4  therapy sessions.  Baseline: Moderate A Goal status: INITIAL  2.  With Supervision A, pt will demo knowledge that he needs to use practical compensation in a situation which has been difficult in the past, x2 sessions Baseline: Moderate A Goal status: INITIAL   ASSESSMENT:  CLINICAL IMPRESSION: Patient is a 43 y.o. female who was seen today for a cognitive communication evaluation d/t report of memory loss.  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   OBJECTIVE IMPAIRMENTS include memory. These impairments are limiting patient from return to work, managing medications, managing appointments, managing finances, household responsibilities, and ADLs/IADLs. Factors affecting potential to achieve goals and functional outcome are ability to learn/carryover information, previous level of function, and severity of impairments. Patient will benefit from skilled SLP services to address above impairments and improve overall function.  REHAB POTENTIAL: Good  PLAN: SLP FREQUENCY: 1-2x/week  SLP DURATION: 12 weeks  PLANNED INTERVENTIONS: Cognitive reorganization, Internal/external aids, Functional tasks, SLP instruction and feedback, Compensatory strategies, and Patient/family education   Levan Aloia B. Rubbie, M.S., CCC-SLP, CBIS Speech-Language Pathologist Certified Brain Injury Specialist [place]  [facility] Rehabilitation Services Office  [phone] Ascom [phone] Fax [phone]   "

## 2024-04-26 ENCOUNTER — Other Ambulatory Visit: Payer: Self-pay | Admitting: Neurology

## 2024-04-26 DIAGNOSIS — G3184 Mild cognitive impairment, so stated: Secondary | ICD-10-CM

## 2024-04-28 ENCOUNTER — Ambulatory Visit
Admission: RE | Admit: 2024-04-28 | Discharge: 2024-04-28 | Disposition: A | Discharge status: Home / Self Care | Source: Ambulatory Visit | Attending: Neurology | Admitting: Neurology

## 2024-04-28 DIAGNOSIS — G3184 Mild cognitive impairment, so stated: Secondary | ICD-10-CM | POA: Diagnosis present

## 2024-05-01 ENCOUNTER — Ambulatory Visit: Admitting: Speech Pathology

## 2024-05-01 DIAGNOSIS — R41841 Cognitive communication deficit: Secondary | ICD-10-CM | POA: Diagnosis not present

## 2024-05-01 NOTE — Therapy (Unsigned)
 " OUTPATIENT SPEECH LANGUAGE PATHOLOGY  TREATMENT NOTE   Patient Name: Melanie Anthony MRN: [id number] DOB:[birth year], 43 y.o., female Today's Date: 05/01/2024  PCP: Glendia Metro, PA REFERRING PROVIDER: Jannett Fairly, MD   End of Session - 05/01/24 0934     Visit Number 3    Number of Visits 25    Date for Recertification  07/10/24    Authorization Type [place] Medicaid    Authorization Time Period 04/24/2024 thru 07/23/2024    Authorization - Visit Number 2    Authorization - Number of Visits 18    Progress Note Due on Visit 10    SLP Start Time 0930    SLP Stop Time  1015    SLP Time Calculation (min) 45 min    Activity Tolerance Patient tolerated treatment well          Past Medical History:  Diagnosis Date   Hypertension    Past Surgical History:  Procedure Laterality Date   FOOT SURGERY     There are no active problems to display for this patient.   ONSET DATE: chronic issue since childhood, documented in chart > 2021; date of referral  04/07/2024  REFERRING DIAG: G31.84 (ICD-10-CM) - Mild cognitive impairment of uncertain or unknown etiology  THERAPY DIAG:  Cognitive communication deficit  Rationale for Evaluation and Treatment Rehabilitation  SUBJECTIVE:   PERTINENT HISTORY and DIAGNOSTIC FINDINGS:   Pt is a 43 year old female with the following medical history: (12/23/2018) -the patient was seen at [facility] ED for a headache and the CT Head revealed a small chronic right cerebellar infarct and the patient was discharged and scheduled to follow up with Neurology. Upon follow up, the patient underwent a MR angio of the head and MR brain w/o contrast which also confirmed the presence of a small chronic right cerebellar infarct (suspect incidental finding).   12/19/2020 SLUMS 12/30  01/07/2021 - MRI There are a few small scattered foci of T2/FLAIR hyperintense signal abnormality within the bilateral cerebral white matter, nonspecific but most often secondary  to chronic small vessel ischemia.  Redemonstrated small chronic infarct within the right cerebellar hemisphere.   10/07/2021 [facility] Previous neuological assessment felt pt's memory deficts were related to vitamin B deficieny, poor sleep and anxiety   PAIN:  Are you having pain? No   FALLS: Has patient fallen in last 6 months?  No  LIVING ENVIRONMENT: Lives with: lives with their family, lives with their daughter, and and pt's mother Lives in: House/apartment  PLOF:  Level of assistance: Needed assistance with ADLs, Needed assistance with IADLS Employment: On disability d/t memory issues   PATIENT GOALS   to improve memory  SUBJECTIVE STATEMENT: Pt pleasant, eager Pt accompanied by: self  OBJECTIVE:   TODAY'S TREATMENT:   Skilled treatment session focused on pt's cognitive communication goals. SLP facilitated session by providing the following interventions:  Pt unable to answer follow up questions regarding recommendations made during previous session. She responds just anything that I do throughout the day I try to jot it down that I have done that, like when I take my medicines, I put it in my calendar - took medicines. Pt with attempt to check [facility] Mychart for response from neurologist regarding medication and follow up MRI she was not able to locate password. Written information provided on creating a central location for all passwords.    Given pt's response to SLP instruction (specific written steps  for strategies) suspect difficulty with implementation d/t potential deficits in sequencing, problem solving, executive functions in addition to potential memory deficits. Therefore standardized formal assessment provided.   Cognitive Linguistic Quick Test: AGE - 18 - 69   The Cognitive Linguistic Quick Test (CLQT) was administered to assess the relative status of five cognitive domains: attention,  memory, language, executive functioning, and visuospatial skills. Scores from 10 tasks were used to estimate severity ratings (standardized for age groups 18-69 years and 70-89 years) for each domain, a clock drawing task, as well as an overall composite severity rating of cognition.       Task Score Criterion Cut Scores  Personal Facts 8/8 8  Symbol Cancellation 12/12 11  Confrontation Naming 10/10 10  Clock Drawing  13/13 12  Story Retelling 3/10 6  Symbol Trails 4/10 9  Generative Naming 5/9 5  Design Memory 5/6 5  Mazes  7/8 7  Design Generation 2/13 6    Cognitive Domain Composite Score Severity Rating  Attention 166/215 Mild  Memory 129/185 Moderate  Executive Function 18/40 Moderate  Language 26/37 Moderate  Visuospatial Skills 75/105 Mild  Clock Drawing  13/13 WNL  Composite Severity Rating  Mild to moderate     PATIENT EDUCATION: Education details: see above Person educated: Patient Education method: Explanation Education comprehension: needs further education   HOME EXERCISE PROGRAM:  Being implementing use of external memory aids    GOALS:  Goals reviewed with patient? Yes  SHORT TERM GOALS: Target date: 10 sessions  To identify emerging areas of strength/need, patient will participate in ongoing diagnostic treatment (Mini Addenbrooke's Cognitive Examination, paragraph recall, functional reading/writing)  Baseline: not able to administer d/t pt arriving late to session Goal status: INITIAL  2.  Pt. will recall 2/4 memory strategies (w-write it, r-repeat, a-associate it, p-picture) and utilize strategies in structured and functional memory tasks to recall 80% of presented information given moderate cues.   Baseline: Maximal use of external memory strategies Goal status: INITIAL  3.   With Min A, patient will use external aid for functional recall of completed/upcoming activities 75% accuracy.    Baseline: Moderate A Goal status: INITIAL   LONG TERM  GOALS: Target date: 07/11/2023   With Supervision A, patient will use external aids to manage appointments, errands, and household chores consistently between 4 therapy sessions.  Baseline: Moderate A Goal status: INITIAL  2.  With Supervision A, pt will demo knowledge that he needs to use practical compensation in a situation which has been difficult in the past, x2 sessions Baseline: Moderate A Goal status: INITIAL   ASSESSMENT:  CLINICAL IMPRESSION: Patient is a 43 y.o. female who was seen today for a cognitive communication evaluation d/t report of memory loss.  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   During formal testing today, pt presents with mild to moderate cognitive impairment with global deficits displayed across all cognitive domains. Pt appears unaware of significant deficits in executive functions as she reports most noticeable deficits in memory. Pt is observed unable to follow written strategies to improve memory d/t deficits in sequencing, task organization, reasoning, executive functions as confirmed in the above testing. As a result, she has decreased compliance and reports continued use of pre-existing strategies that are ineffective.    OBJECTIVE IMPAIRMENTS include memory. These impairments are limiting patient from return to  work, managing medications, managing appointments, managing finances, household responsibilities, and ADLs/IADLs. Factors affecting potential to achieve goals and functional outcome are ability to learn/carryover information, previous level of function, and severity of impairments. Patient will benefit from skilled SLP services to address above impairments and improve overall function.  REHAB POTENTIAL: Good  PLAN: SLP FREQUENCY: 1-2x/week  SLP DURATION: 12 weeks  PLANNED  INTERVENTIONS: Cognitive reorganization, Internal/external aids, Functional tasks, SLP instruction and feedback, Compensatory strategies, and Patient/family education   Callia Swim B. Rubbie, M.S., CCC-SLP, CBIS Speech-Language Pathologist Certified Brain Injury Specialist [facility]  [facility] [phone] Ascom [phone] Fax [phone]   "

## 2024-05-03 ENCOUNTER — Ambulatory Visit: Admitting: Speech Pathology

## 2024-05-03 DIAGNOSIS — R41841 Cognitive communication deficit: Secondary | ICD-10-CM

## 2024-05-03 NOTE — Therapy (Signed)
 " OUTPATIENT SPEECH LANGUAGE PATHOLOGY  TREATMENT NOTE   Patient Name: Melanie Anthony MRN: [id number] DOB:10-28-1980, 43 y.o., female Today's Date: 05/03/2024  PCP: Glendia Metro, PA REFERRING PROVIDER: Jannett Fairly, MD   End of Session - 05/03/24 1313     Visit Number 4    Number of Visits 25    Date for Recertification  07/10/24    Authorization Type [place] Medicaid    Authorization Time Period 04/24/2024 thru 07/23/2024    Authorization - Visit Number 3    Authorization - Number of Visits 18    Progress Note Due on Visit 10    SLP Start Time 1315    SLP Stop Time  1400    SLP Time Calculation (min) 45 min    Activity Tolerance Patient tolerated treatment well          Past Medical History:  Diagnosis Date   Hypertension    Past Surgical History:  Procedure Laterality Date   FOOT SURGERY     There are no active problems to display for this patient.   ONSET DATE: chronic issue since childhood, documented in chart > 2021; date of referral  04/07/2024  REFERRING DIAG: G31.84 (ICD-10-CM) - Mild cognitive impairment of uncertain or unknown etiology  THERAPY DIAG:  Cognitive communication deficit  Rationale for Evaluation and Treatment Rehabilitation  SUBJECTIVE:   PERTINENT HISTORY and DIAGNOSTIC FINDINGS:   Pt is a 43 year old female with the following medical history: (12/23/2018) -the patient was seen at [facility] ED for a headache and the CT Head revealed a small chronic right cerebellar infarct and the patient was discharged and scheduled to follow up with Neurology. Upon follow up, the patient underwent a MR angio of the head and MR brain w/o contrast which also confirmed the presence of a small chronic right cerebellar infarct (suspect incidental finding).   12/19/2020 SLUMS 12/30  01/07/2021 - MRI There are a few small scattered foci of T2/FLAIR hyperintense signal abnormality within the bilateral cerebral white matter, nonspecific but most often secondary  to chronic small vessel ischemia.  Redemonstrated small chronic infarct within the right cerebellar hemisphere.   10/07/2021 [facility] Previous neuological assessment felt pt's memory deficts were related to vitamin B deficieny, poor sleep and anxiety   PAIN:  Are you having pain? No   FALLS: Has patient fallen in last 6 months?  No  LIVING ENVIRONMENT: Lives with: lives with their family, lives with their daughter, and and pt's mother Lives in: House/apartment  PLOF:  Level of assistance: Needed assistance with ADLs, Needed assistance with IADLS Employment: On disability d/t memory issues   PATIENT GOALS   to improve memory  SUBJECTIVE STATEMENT: Pt pleasant, eager Pt accompanied by: self and her significant other.   OBJECTIVE:   TODAY'S TREATMENT:   Skilled treatment session focused on pt's cognitive communication goals. SLP facilitated session by providing the following interventions:  Pt's significant other accompanied pt back to therapy room. He reports she might ask me the same thing over and over. She called me the other day to after your session to ask me where her car was parked.  He reports stable memory difficulties since knowing her for the last 5-6 years. She also reports, I get frustrated sometimes because I can't remember.   Skilled verbal and written education/instruction on using alarms to aid in prospective memory (such as reviewing ST recommendations) as well as using pictures to help  recall information of objects, looking at memory games and location app for her car. Pt very responsive.   PATIENT EDUCATION: Education details: see above Person educated: Patient Education method: Explanation Education comprehension: needs further education   HOME EXERCISE PROGRAM:  Being implementing use of external memory aids    GOALS:  Goals reviewed with patient? Yes  SHORT TERM  GOALS: Target date: 10 sessions  To identify emerging areas of strength/need, patient will participate in ongoing diagnostic treatment (Mini Addenbrooke's Cognitive Examination, paragraph recall, functional reading/writing)  Baseline: not able to administer d/t pt arriving late to session Goal status: INITIAL  2.  Pt. will recall 2/4 memory strategies (w-write it, r-repeat, a-associate it, p-picture) and utilize strategies in structured and functional memory tasks to recall 80% of presented information given moderate cues.   Baseline: Maximal use of external memory strategies Goal status: INITIAL  3.   With Min A, patient will use external aid for functional recall of completed/upcoming activities 75% accuracy.    Baseline: Moderate A Goal status: INITIAL   LONG TERM GOALS: Target date: 07/11/2023   With Supervision A, patient will use external aids to manage appointments, errands, and household chores consistently between 4 therapy sessions.  Baseline: Moderate A Goal status: INITIAL  2.  With Supervision A, pt will demo knowledge that he needs to use practical compensation in a situation which has been difficult in the past, x2 sessions Baseline: Moderate A Goal status: INITIAL   ASSESSMENT:  CLINICAL IMPRESSION: Patient is a 43 y.o. female who was seen today for a cognitive communication evaluation d/t report of memory loss.  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   During formal testing today, pt presents with mild to moderate cognitive impairment with global deficits displayed across all cognitive domains. Pt appears unaware of significant deficits in executive functions as she reports most noticeable deficits in memory. Pt is observed unable to follow written strategies to improve memory d/t deficits in  sequencing, task organization, reasoning, executive functions as confirmed in the above testing. As a result, she has decreased compliance and reports continued use of pre-existing strategies that are ineffective.    OBJECTIVE IMPAIRMENTS include memory. These impairments are limiting patient from return to work, managing medications, managing appointments, managing finances, household responsibilities, and ADLs/IADLs. Factors affecting potential to achieve goals and functional outcome are ability to learn/carryover information, previous level of function, and severity of impairments. Patient will benefit from skilled SLP services to address above impairments and improve overall function.  REHAB POTENTIAL: Good  PLAN: SLP FREQUENCY: 1-2x/week  SLP DURATION: 12 weeks  PLANNED INTERVENTIONS: Cognitive reorganization, Internal/external aids, Functional tasks, SLP instruction and feedback, Compensatory strategies, and Patient/family education   Lowen Barringer B. Rubbie, M.S., CCC-SLP, CBIS Speech-Language Pathologist Certified Brain Injury Specialist [facility]  [facility] [phone] Ascom [phone] Fax [phone]   "

## 2024-05-08 ENCOUNTER — Ambulatory Visit: Attending: Neurology | Admitting: Speech Pathology

## 2024-05-08 DIAGNOSIS — R41841 Cognitive communication deficit: Secondary | ICD-10-CM | POA: Diagnosis present

## 2024-05-08 NOTE — Therapy (Signed)
 " OUTPATIENT SPEECH LANGUAGE PATHOLOGY  TREATMENT NOTE   Patient Name: Melanie Anthony MRN: [id number] DOB:1980/12/20, 44 y.o., female Today's Date: 05/08/2024  PCP: Glendia Metro, PA REFERRING PROVIDER: Jannett Fairly, MD   End of Session - 05/08/24 1143     Visit Number 5    Number of Visits 25    Date for Recertification  07/10/24    Authorization Type [place] Medicaid    Authorization Time Period 04/24/2024 thru 07/23/2024    Authorization - Visit Number 4    Authorization - Number of Visits 18    Progress Note Due on Visit 10    SLP Start Time 1145    SLP Stop Time  1230    SLP Time Calculation (min) 45 min    Activity Tolerance Patient tolerated treatment well          Past Medical History:  Diagnosis Date   Hypertension    Past Surgical History:  Procedure Laterality Date   FOOT SURGERY     There are no active problems to display for this patient.   ONSET DATE: chronic issue since childhood, documented in chart > 2021; date of referral  04/07/2024  REFERRING DIAG: G31.84 (ICD-10-CM) - Mild cognitive impairment of uncertain or unknown etiology  THERAPY DIAG:  Cognitive communication deficit  Rationale for Evaluation and Treatment Rehabilitation  SUBJECTIVE:   PERTINENT HISTORY and DIAGNOSTIC FINDINGS:   Pt is a 44 year old female with the following medical history: (12/23/2018) -the patient was seen at [facility] ED for a headache and the CT Head revealed a small chronic right cerebellar infarct and the patient was discharged and scheduled to follow up with Neurology. Upon follow up, the patient underwent a MR angio of the head and MR brain w/o contrast which also confirmed the presence of a small chronic right cerebellar infarct (suspect incidental finding).   12/19/2020 SLUMS 12/30  01/07/2021 - MRI There are a few small scattered foci of T2/FLAIR hyperintense signal abnormality within the bilateral cerebral white matter, nonspecific but most often secondary to  chronic small vessel ischemia.  Redemonstrated small chronic infarct within the right cerebellar hemisphere.   10/07/2021 [facility] Previous neuological assessment felt pt's memory deficts were related to vitamin B deficieny, poor sleep and anxiety   PAIN:  Are you having pain? No   FALLS: Has patient fallen in last 6 months?  No  LIVING ENVIRONMENT: Lives with: lives with their family, lives with their daughter, and and pt's mother Lives in: House/apartment  PLOF:  Level of assistance: Needed assistance with ADLs, Needed assistance with IADLS Employment: On disability d/t memory issues   PATIENT GOALS   to improve memory  SUBJECTIVE STATEMENT: Pt pleasant, eager Pt accompanied by: self and her significant other.   OBJECTIVE:   TODAY'S TREATMENT:   Skilled treatment session focused on pt's cognitive communication goals. SLP facilitated session by providing the following interventions:  While pt remains very responsive throughout the session, she is not able to complete recommendations, specific strategies to improve functional independence. She reports that her alarm went of to remind her to look at her speech notes and she reports looking at her pages of speech notes but she is not able to describe not completing any of the recommendations listed within the notes (such as organizing her passwords, taking a picture of where she parks). At this time, it is difficult to identify a single cause as there evidence of  the following: no recall of activity (when reading the activity, she has not recall of what it means), self-reported laid-back personality, supportive family, word retrieval/comprehension deficits potentially related to memory deficits.   At this time, this writer is unable to recommend alternative forms of restorative or compensatory activities/aids/strategies, will reach out to referring neurologist  about potential referral to neuropsychologist and results of MRI.   PATIENT EDUCATION: Education details: see above Person educated: Patient Education method: Explanation Education comprehension: needs further education   HOME EXERCISE PROGRAM:  Being implementing use of external memory aids    GOALS:  Goals reviewed with patient? Yes  SHORT TERM GOALS: Target date: 10 sessions  To identify emerging areas of strength/need, patient will participate in ongoing diagnostic treatment (Mini Addenbrooke's Cognitive Examination, paragraph recall, functional reading/writing)  Baseline: not able to administer d/t pt arriving late to session Goal status: INITIAL  2.  Pt. will recall 2/4 memory strategies (w-write it, r-repeat, a-associate it, p-picture) and utilize strategies in structured and functional memory tasks to recall 80% of presented information given moderate cues.   Baseline: Maximal use of external memory strategies Goal status: INITIAL  3.   With Min A, patient will use external aid for functional recall of completed/upcoming activities 75% accuracy.    Baseline: Moderate A Goal status: INITIAL   LONG TERM GOALS: Target date: 07/11/2023   With Supervision A, patient will use external aids to manage appointments, errands, and household chores consistently between 4 therapy sessions.  Baseline: Moderate A Goal status: INITIAL  2.  With Supervision A, pt will demo knowledge that he needs to use practical compensation in a situation which has been difficult in the past, x2 sessions Baseline: Moderate A Goal status: INITIAL   ASSESSMENT:  CLINICAL IMPRESSION: Patient is a 44 y.o. female who was seen today for a cognitive communication evaluation d/t report of memory loss.  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   During formal testing today, pt presents with mild to moderate cognitive impairment with global deficits displayed across all cognitive domains. Pt appears unaware of significant deficits in executive functions as she reports most noticeable deficits in memory. Pt is observed unable to follow written strategies to improve memory d/t deficits in sequencing, task organization, reasoning, executive functions as confirmed in the above testing. As a result, she has decreased compliance and reports continued use of pre-existing strategies that are ineffective.   Pt continues with inability to complete HEP, will place pt on hold pending information from neurologist regarding neuropsychological referral and results of MRI.  OBJECTIVE IMPAIRMENTS include memory. These impairments are limiting patient from return to work, managing medications, managing appointments, managing finances, household responsibilities, and ADLs/IADLs. Factors affecting potential to achieve goals and functional outcome are ability to learn/carryover information, previous level of function, and severity of impairments. Patient will benefit from skilled SLP services to address above impairments and improve overall function.  REHAB POTENTIAL: Good  PLAN: SLP FREQUENCY: 1-2x/week  SLP DURATION: 12 weeks  PLANNED INTERVENTIONS: Cognitive reorganization, Internal/external aids, Functional tasks, SLP instruction and feedback, Compensatory strategies, and Patient/family education   Rudy Domek B. Rubbie, M.S., CCC-SLP, CBIS Speech-Language Pathologist Certified Brain Injury Specialist [facility]  [facility] [phone] Ascom [phone] Fax [phone]   "

## 2024-05-15 ENCOUNTER — Telehealth: Payer: Self-pay | Admitting: Speech Pathology

## 2024-05-15 NOTE — Telephone Encounter (Signed)
 This clinical research associate left voicemail for potential appt this Wednesday. Requested she call our office back to schedule.   Lavona Norsworthy B. Rubbie, M.S., CCC-SLP, Tree Surgeon Certified Brain Injury Specialist [facility]  [facility] Rehabilitation Services Office [phone] Ascom [phone] Fax [phone]

## 2024-05-17 ENCOUNTER — Ambulatory Visit: Admitting: Speech Pathology

## 2024-05-17 DIAGNOSIS — R41841 Cognitive communication deficit: Secondary | ICD-10-CM

## 2024-05-17 NOTE — Therapy (Signed)
 " OUTPATIENT SPEECH LANGUAGE PATHOLOGY  TREATMENT NOTE   Patient Name: Melanie Anthony MRN: [id number] DOB:[birth year], 44 y.o., female Today's Date: 05/17/2024  PCP: Glendia Metro, PA REFERRING PROVIDER: Jannett Fairly, MD   End of Session - 05/17/24 1309     Visit Number 6    Number of Visits 25    Date for Recertification  07/10/24    Authorization Type [place] Medicaid    Authorization Time Period 04/24/2024 thru 07/23/2024    Authorization - Visit Number 5    Authorization - Number of Visits 18    Progress Note Due on Visit 10    SLP Start Time 1145    SLP Stop Time  1230    SLP Time Calculation (min) 45 min    Activity Tolerance Patient tolerated treatment well          Past Medical History:  Diagnosis Date   Hypertension    Past Surgical History:  Procedure Laterality Date   FOOT SURGERY     There are no active problems to display for this patient.   ONSET DATE: chronic issue since childhood, documented in chart > 2021; date of referral  04/07/2024  REFERRING DIAG: G31.84 (ICD-10-CM) - Mild cognitive impairment of uncertain or unknown etiology  THERAPY DIAG:  Cognitive communication deficit  Rationale for Evaluation and Treatment Rehabilitation  SUBJECTIVE:   PERTINENT HISTORY and DIAGNOSTIC FINDINGS:   Pt is a 44 year old female with the following medical history: (12/23/2018) -the patient was seen at [facility] ED for a headache and the CT Head revealed a small chronic right cerebellar infarct and the patient was discharged and scheduled to follow up with Neurology. Upon follow up, the patient underwent a MR angio of the head and MR brain w/o contrast which also confirmed the presence of a small chronic right cerebellar infarct (suspect incidental finding).   12/19/2020 SLUMS 12/30  01/07/2021 - MRI There are a few small scattered foci of T2/FLAIR hyperintense signal abnormality within the bilateral cerebral white matter, nonspecific but most often secondary to  chronic small vessel ischemia.  Redemonstrated small chronic infarct within the right cerebellar hemisphere.   10/07/2021 [facility] Previous neuological assessment felt pt's memory deficts were related to vitamin B deficieny, poor sleep and anxiety  04/28/2024 MRI No acute intracranial abnormality or significant change since the prior study  PAIN:  Are you having pain? No   FALLS: Has patient fallen in last 6 months?  No  LIVING ENVIRONMENT: Lives with: lives with their family, lives with their daughter, and and pt's mother Lives in: House/apartment  PLOF:  Level of assistance: Needed assistance with ADLs, Needed assistance with IADLS Employment: On disability d/t memory issues   PATIENT GOALS   to improve memory  SUBJECTIVE STATEMENT: Pt pleasant, eager Pt accompanied by: self   OBJECTIVE:   TODAY'S TREATMENT:   Skilled treatment session focused on pt's cognitive communication goals. SLP facilitated session by providing the following interventions:  Pt states that she drove herself and I took a picture of where I parked. Pt also brought in a journal with information regarding activities performed each day and mentions that she has started using a pill organizer that is much easier  SLP provided restorative memory tasks which pt performed well on (see below) Mental Manipulation tasks: 98% independently Identifying salient/important information within sentences (2 facts) pt with 90% when presented with basic sentences - moderate A required when sentences increased  in length but not complexity.    PATIENT EDUCATION: Education details: see above Person educated: Patient Education method: Explanation Education comprehension: needs further education   HOME EXERCISE PROGRAM:  Being implementing use of external memory aids    GOALS:  Goals reviewed with patient? Yes  SHORT TERM GOALS: Target  date: 10 sessions  To identify emerging areas of strength/need, patient will participate in ongoing diagnostic treatment (Mini Addenbrooke's Cognitive Examination, paragraph recall, functional reading/writing)  Baseline: not able to administer d/t pt arriving late to session Goal status: INITIAL  2.  Pt. will recall 2/4 memory strategies (w-write it, r-repeat, a-associate it, p-picture) and utilize strategies in structured and functional memory tasks to recall 80% of presented information given moderate cues.   Baseline: Maximal use of external memory strategies Goal status: INITIAL  3.   With Min A, patient will use external aid for functional recall of completed/upcoming activities 75% accuracy.    Baseline: Moderate A Goal status: INITIAL   LONG TERM GOALS: Target date: 07/11/2023   With Supervision A, patient will use external aids to manage appointments, errands, and household chores consistently between 4 therapy sessions.  Baseline: Moderate A Goal status: INITIAL  2.  With Supervision A, pt will demo knowledge that he needs to use practical compensation in a situation which has been difficult in the past, x2 sessions Baseline: Moderate A Goal status: INITIAL   ASSESSMENT:  CLINICAL IMPRESSION: Patient is a 44 y.o. female who was seen today for a cognitive communication evaluation d/t report of memory loss.  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   During formal testing today, pt presents with mild to moderate cognitive impairment with global deficits displayed across all cognitive domains. Pt appears unaware of significant deficits in executive functions as she reports most noticeable deficits in memory. Pt is observed unable to follow written strategies to improve memory d/t deficits in sequencing, task  organization, reasoning, executive functions as confirmed in the above testing. As a result, she has decreased compliance and reports continued use of pre-existing strategies that are ineffective.   Pt continues with inability to complete HEP, will place pt on hold pending information from neurologist regarding neuropsychological referral and results of MRI.  OBJECTIVE IMPAIRMENTS include memory. These impairments are limiting patient from return to work, managing medications, managing appointments, managing finances, household responsibilities, and ADLs/IADLs. Factors affecting potential to achieve goals and functional outcome are ability to learn/carryover information, previous level of function, and severity of impairments. Patient will benefit from skilled SLP services to address above impairments and improve overall function.  REHAB POTENTIAL: Good  PLAN: SLP FREQUENCY: 1-2x/week  SLP DURATION: 12 weeks  PLANNED INTERVENTIONS: Cognitive reorganization, Internal/external aids, Functional tasks, SLP instruction and feedback, Compensatory strategies, and Patient/family education   Bryony Kaman B. Rubbie, M.S., CCC-SLP, CBIS Speech-Language Pathologist Certified Brain Injury Specialist [facility]  [facility] [phone] Ascom [phone] Fax [phone]   "

## 2024-05-22 ENCOUNTER — Ambulatory Visit: Admitting: Speech Pathology

## 2024-05-22 DIAGNOSIS — R41841 Cognitive communication deficit: Secondary | ICD-10-CM | POA: Diagnosis not present

## 2024-05-22 NOTE — Therapy (Unsigned)
 " OUTPATIENT SPEECH LANGUAGE PATHOLOGY  TREATMENT NOTE   Patient Name: Melanie Anthony MRN: [id number] DOB:03-Jul-1980, 44 y.o., female Today's Date: 05/22/2024  PCP: Glendia Metro, PA REFERRING PROVIDER: Jannett Fairly, MD   End of Session - 05/22/24 2047     Visit Number 7    Number of Visits 25    Date for Recertification  07/10/24    Authorization Type  Medicaid    Authorization Time Period 04/24/2024 thru 07/23/2024    Authorization - Visit Number 6    Authorization - Number of Visits 18    Progress Note Due on Visit 10    SLP Start Time 0855    SLP Stop Time  0930    SLP Time Calculation (min) 35 min    Activity Tolerance Patient tolerated treatment well          Past Medical History:  Diagnosis Date   Hypertension    Past Surgical History:  Procedure Laterality Date   FOOT SURGERY     There are no active problems to display for this patient.   ONSET DATE: chronic issue since childhood, documented in chart > 2021; date of referral  04/07/2024  REFERRING DIAG: G31.84 (ICD-10-CM) - Mild cognitive impairment of uncertain or unknown etiology  THERAPY DIAG:  Cognitive communication deficit  Rationale for Evaluation and Treatment Rehabilitation  SUBJECTIVE:   PERTINENT HISTORY and DIAGNOSTIC FINDINGS:   Pt is a 44 year old female with the following medical history: (12/23/2018) -the patient was seen at [facility] ED for a headache and the CT Head revealed a small chronic right cerebellar infarct and the patient was discharged and scheduled to follow up with Neurology. Upon follow up, the patient underwent a MR angio of the head and MR brain w/o contrast which also confirmed the presence of a small chronic right cerebellar infarct (suspect incidental finding).   12/19/2020 [facility] 12/30  01/07/2021 - MRI There are a few small scattered foci of T2/FLAIR hyperintense signal abnormality within the bilateral cerebral white matter, nonspecific but most often secondary to  chronic small vessel ischemia.  Redemonstrated small chronic infarct within the right cerebellar hemisphere.   10/07/2021 [facility] Previous neuological assessment felt pt's memory deficts were related to vitamin B deficieny, poor sleep and anxiety  04/28/2024 MRI No acute intracranial abnormality or significant change since the prior study  PAIN:  Are you having pain? No   FALLS: Has patient fallen in last 6 months?  No  LIVING ENVIRONMENT: Lives with: lives with their family, lives with their daughter, and and pt's mother Lives in: House/apartment  PLOF:  Level of assistance: Needed assistance with ADLs, Needed assistance with IADLS Employment: On disability d/t memory issues   PATIENT GOALS   to improve memory  SUBJECTIVE STATEMENT: Pt pleasant, eager Pt accompanied by: self   OBJECTIVE:   TODAY'S TREATMENT:   Skilled treatment session focused on pt's cognitive communication goals. SLP facilitated session by providing the following interventions:  Pt states that she drove herself and I took a picture of where I parked. Pt also brought in a journal with information regarding activities performed each day and mentions that she has started using a pill organizer that is much easier  SLP provided restorative memory tasks which pt performed well on (see below) Mental Manipulation tasks: 98% independently Identifying salient/important information within sentences (2 facts) pt with 90% when presented with basic sentences - moderate A required when sentences increased  in length but not complexity.    PATIENT EDUCATION: Education details: see above Person educated: Patient Education method: Explanation Education comprehension: needs further education   HOME EXERCISE PROGRAM:  Being implementing use of external memory aids    GOALS:  Goals reviewed with patient? Yes  SHORT TERM GOALS: Target  date: 10 sessions  To identify emerging areas of strength/need, patient will participate in ongoing diagnostic treatment (Mini Addenbrooke's Cognitive Examination, paragraph recall, functional reading/writing)  Baseline: not able to administer d/t pt arriving late to session Goal status: INITIAL  2.  Pt. will recall 2/4 memory strategies (w-write it, r-repeat, a-associate it, p-picture) and utilize strategies in structured and functional memory tasks to recall 80% of presented information given moderate cues.   Baseline: Maximal use of external memory strategies Goal status: INITIAL  3.   With Min A, patient will use external aid for functional recall of completed/upcoming activities 75% accuracy.    Baseline: Moderate A Goal status: INITIAL   LONG TERM GOALS: Target date: 07/11/2023   With Supervision A, patient will use external aids to manage appointments, errands, and household chores consistently between 4 therapy sessions.  Baseline: Moderate A Goal status: INITIAL  2.  With Supervision A, pt will demo knowledge that he needs to use practical compensation in a situation which has been difficult in the past, x2 sessions Baseline: Moderate A Goal status: INITIAL   ASSESSMENT:  CLINICAL IMPRESSION: Patient is a 44 y.o. female who was seen today for a cognitive communication evaluation d/t report of memory loss.  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   During formal testing today, pt presents with mild to moderate cognitive impairment with global deficits displayed across all cognitive domains. Pt appears unaware of significant deficits in executive functions as she reports most noticeable deficits in memory. Pt is observed unable to follow written strategies to improve memory d/t deficits in sequencing, task  organization, reasoning, executive functions as confirmed in the above testing. As a result, she has decreased compliance and reports continued use of pre-existing strategies that are ineffective.   Pt continues with inability to complete HEP, will place pt on hold pending information from neurologist regarding neuropsychological referral and results of MRI.  OBJECTIVE IMPAIRMENTS include memory. These impairments are limiting patient from return to work, managing medications, managing appointments, managing finances, household responsibilities, and ADLs/IADLs. Factors affecting potential to achieve goals and functional outcome are ability to learn/carryover information, previous level of function, and severity of impairments. Patient will benefit from skilled SLP services to address above impairments and improve overall function.  REHAB POTENTIAL: Good  PLAN: SLP FREQUENCY: 1-2x/week  SLP DURATION: 12 weeks  PLANNED INTERVENTIONS: Cognitive reorganization, Internal/external aids, Functional tasks, SLP instruction and feedback, Compensatory strategies, and Patient/family education   Sandia Pfund B. Rubbie, M.S., CCC-SLP, CBIS Speech-Language Pathologist Certified Brain Injury Specialist [facility]  [facility] [phone] Ascom [phone] Fax [phone]   "

## 2024-05-24 ENCOUNTER — Ambulatory Visit: Admitting: Speech Pathology

## 2024-05-24 ENCOUNTER — Telehealth: Payer: Self-pay | Admitting: Speech Pathology

## 2024-05-24 NOTE — Telephone Encounter (Signed)
 Pt was a no call-no show for today's ST session. This clinical research associate called and left a voicemail on the number listed in pt's chart. Requested that she call and confirm next appt.   Melanie Anthony B. Rubbie, M.S., CCC-SLP, Tree Surgeon Certified Brain Injury Specialist [facility]  [facility] Rehabilitation Services Office [phone] Ascom [phone] Fax [phone]

## 2024-05-31 ENCOUNTER — Ambulatory Visit: Admitting: Speech Pathology

## 2024-06-02 ENCOUNTER — Telehealth: Payer: Self-pay | Admitting: Speech Pathology

## 2024-06-02 ENCOUNTER — Ambulatory Visit: Admitting: Speech Pathology

## 2024-06-02 NOTE — Telephone Encounter (Signed)
 Pt was a no call/no show for her ST appt this week. Information left for pt to call rehabilitation dept should she desire to schedule further appts. We will await her return phone call before pursuing additional sessions.   Jacorion Klem B. Rubbie, M.S., CCC-SLP, Tree Surgeon Certified Brain Injury Specialist [facility]  [facility] Rehabilitation Services Office [phone] Ascom [phone] Fax [phone]
